# Patient Record
Sex: Male | Born: 1995 | Race: Black or African American | Hispanic: No | Marital: Single | Smoking: Former smoker
Health system: Southern US, Community
[De-identification: ages and names within clinical notes are randomized; demographics above are authoritative.]

## PROBLEM LIST (undated history)

## (undated) DIAGNOSIS — J45909 Unspecified asthma, uncomplicated: Secondary | ICD-10-CM

## (undated) HISTORY — PX: FRACTURE SURGERY: SHX138

---
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
## 2014-05-18 DIAGNOSIS — G8929 Other chronic pain: Secondary | ICD-10-CM | POA: Insufficient documentation

## 2015-02-22 DIAGNOSIS — S82202A Unspecified fracture of shaft of left tibia, initial encounter for closed fracture: Secondary | ICD-10-CM | POA: Insufficient documentation

## 2015-02-22 DIAGNOSIS — S82202S Unspecified fracture of shaft of left tibia, sequela: Secondary | ICD-10-CM | POA: Insufficient documentation

## 2015-06-27 ENCOUNTER — Encounter: Payer: Self-pay | Admitting: Emergency Medicine

## 2015-06-27 ENCOUNTER — Emergency Department
Admission: EM | Admit: 2015-06-27 | Discharge: 2015-06-27 | Disposition: A | Discharge status: Home / Self Care | Payer: Medicaid Other | Attending: Emergency Medicine | Admitting: Emergency Medicine

## 2015-06-27 DIAGNOSIS — F17200 Nicotine dependence, unspecified, uncomplicated: Secondary | ICD-10-CM | POA: Diagnosis not present

## 2015-06-27 DIAGNOSIS — Z113 Encounter for screening for infections with a predominantly sexual mode of transmission: Secondary | ICD-10-CM

## 2015-06-27 LAB — URINALYSIS COMPLETE WITH MICROSCOPIC (ARMC ONLY)
BILIRUBIN URINE: NEGATIVE
Bacteria, UA: NONE SEEN
GLUCOSE, UA: NEGATIVE mg/dL
HGB URINE DIPSTICK: NEGATIVE
KETONES UR: NEGATIVE mg/dL
NITRITE: NEGATIVE
PH: 6 (ref 5.0–8.0)
Protein, ur: NEGATIVE mg/dL
SPECIFIC GRAVITY, URINE: 1.006 (ref 1.005–1.030)

## 2015-06-27 LAB — CHLAMYDIA/NGC RT PCR (ARMC ONLY)
CHLAMYDIA TR: NOT DETECTED
N gonorrhoeae: NOT DETECTED

## 2015-06-27 NOTE — ED Notes (Signed)
States he thinks he may have been exposed to STD.also having some burning with urination  No drainage

## 2015-06-27 NOTE — Discharge Instructions (Signed)
Health Maintenance, Male A healthy lifestyle and preventative care can promote health and wellness.  Maintain regular health, dental, and eye exams.  Eat a healthy diet. Foods like vegetables, fruits, whole grains, low-fat dairy products, and lean protein foods contain the nutrients you need and are low in calories. Decrease your intake of foods high in solid fats, added sugars, and salt. Get information about a proper diet from your health care provider, if necessary.  Regular physical exercise is one of the most important things you can do for your health. Most adults should get at least 150 minutes of moderate-intensity exercise (any activity that increases your heart rate and causes you to sweat) each week. In addition, most adults need muscle-strengthening exercises on 2 or more days a week.   Maintain a healthy weight. The body mass index (BMI) is a screening tool to identify possible weight problems. It provides an estimate of body fat based on height and weight. Your health care provider can find your BMI and can help you achieve or maintain a healthy weight. For males 20 years and older:  A BMI below 18.5 is considered underweight.  A BMI of 18.5 to 24.9 is normal.  A BMI of 25 to 29.9 is considered overweight.  A BMI of 30 and above is considered obese.  Maintain normal blood lipids and cholesterol by exercising and minimizing your intake of saturated fat. Eat a balanced diet with plenty of fruits and vegetables. Blood tests for lipids and cholesterol should begin at age 79 and be repeated every 5 years. If your lipid or cholesterol levels are high, you are over age 83, or you are at high risk for heart disease, you may need your cholesterol levels checked more frequently.Ongoing high lipid and cholesterol levels should be treated with medicines if diet and exercise are not working.  If you smoke, find out from your health care provider how to quit. If you do not use tobacco, do not  start.  Lung cancer screening is recommended for adults aged 55-80 years who are at high risk for developing lung cancer because of a history of smoking. A yearly low-dose CT scan of the lungs is recommended for people who have at least a 30-pack-year history of smoking and are current smokers or have quit within the past 15 years. A pack year of smoking is smoking an average of 1 pack of cigarettes a day for 1 year (for example, a 30-pack-year history of smoking could mean smoking 1 pack a day for 30 years or 2 packs a day for 15 years). Yearly screening should continue until the smoker has stopped smoking for at least 15 years. Yearly screening should be stopped for people who develop a health problem that would prevent them from having lung cancer treatment.  If you choose to drink alcohol, do not have more than 2 drinks per day. One drink is considered to be 12 oz (360 mL) of beer, 5 oz (150 mL) of wine, or 1.5 oz (45 mL) of liquor.  Avoid the use of street drugs. Do not share needles with anyone. Ask for help if you need support or instructions about stopping the use of drugs.  High blood pressure causes heart disease and increases the risk of stroke. High blood pressure is more likely to develop in:  People who have blood pressure in the end of the normal range (100-139/85-89 mm Hg).  People who are overweight or obese.  People who are African American.  If you are 18-39 years of age, have your blood pressure checked every 3-5 years. If you are 40 years of age or older, have your blood pressure checked every year. You should have your blood pressure measured twice--once when you are at a hospital or clinic, and once when you are not at a hospital or clinic. Record the average of the two measurements. To check your blood pressure when you are not at a hospital or clinic, you can use:  An automated blood pressure machine at a pharmacy.  A home blood pressure monitor.  If you are 45-79 years  old, ask your health care provider if you should take aspirin to prevent heart disease.  Diabetes screening involves taking a blood sample to check your fasting blood sugar level. This should be done once every 3 years after age 45 if you are at a normal weight and without risk factors for diabetes. Testing should be considered at a younger age or be carried out more frequently if you are overweight and have at least 1 risk factor for diabetes.  Colorectal cancer can be detected and often prevented. Most routine colorectal cancer screening begins at the age of 50 and continues through age 75. However, your health care provider may recommend screening at an earlier age if you have risk factors for colon cancer. On a yearly basis, your health care provider may provide home test kits to check for hidden blood in the stool. A small camera at the end of a tube may be used to directly examine the colon (sigmoidoscopy or colonoscopy) to detect the earliest forms of colorectal cancer. Talk to your health care provider about this at age 50 when routine screening begins. A direct exam of the colon should be repeated every 5-10 years through age 75, unless early forms of precancerous polyps or small growths are found.  People who are at an increased risk for hepatitis B should be screened for this virus. You are considered at high risk for hepatitis B if:  You were born in a country where hepatitis B occurs often. Talk with your health care provider about which countries are considered high risk.  Your parents were born in a high-risk country and you have not received a shot to protect against hepatitis B (hepatitis B vaccine).  You have HIV or AIDS.  You use needles to inject street drugs.  You live with, or have sex with, someone who has hepatitis B.  You are a man who has sex with other men (MSM).  You get hemodialysis treatment.  You take certain medicines for conditions like cancer, organ  transplantation, and autoimmune conditions.  Hepatitis C blood testing is recommended for all people born from 1945 through 1965 and any individual with known risk factors for hepatitis C.  Healthy men should no longer receive prostate-specific antigen (PSA) blood tests as part of routine cancer screening. Talk to your health care provider about prostate cancer screening.  Testicular cancer screening is not recommended for adolescents or adult males who have no symptoms. Screening includes self-exam, a health care provider exam, and other screening tests. Consult with your health care provider about any symptoms you have or any concerns you have about testicular cancer.  Practice safe sex. Use condoms and avoid high-risk sexual practices to reduce the spread of sexually transmitted infections (STIs).  You should be screened for STIs, including gonorrhea and chlamydia if:  You are sexually active and are younger than 24 years.  You   are older than 24 years, and your health care provider tells you that you are at risk for this type of infection.  Your sexual activity has changed since you were last screened, and you are at an increased risk for chlamydia or gonorrhea. Ask your health care provider if you are at risk.  If you are at risk of being infected with HIV, it is recommended that you take a prescription medicine daily to prevent HIV infection. This is called pre-exposure prophylaxis (PrEP). You are considered at risk if:  You are a man who has sex with other men (MSM).  You are a heterosexual man who is sexually active with multiple partners.  You take drugs by injection.  You are sexually active with a partner who has HIV.  Talk with your health care provider about whether you are at high risk of being infected with HIV. If you choose to begin PrEP, you should first be tested for HIV. You should then be tested every 3 months for as long as you are taking PrEP.  Use sunscreen. Apply  sunscreen liberally and repeatedly throughout the day. You should seek shade when your shadow is shorter than you. Protect yourself by wearing long sleeves, pants, a wide-brimmed hat, and sunglasses year round whenever you are outdoors.  Tell your health care provider of new moles or changes in moles, especially if there is a change in shape or color. Also, tell your health care provider if a mole is larger than the size of a pencil eraser.  A one-time screening for abdominal aortic aneurysm (AAA) and surgical repair of large AAAs by ultrasound is recommended for men aged 65-75 years who are current or former smokers.  Stay current with your vaccines (immunizations).   This information is not intended to replace advice given to you by your health care provider. Make sure you discuss any questions you have with your health care provider.   Document Released: 08/10/2007 Document Revised: 03/04/2014 Document Reviewed: 07/09/2010 Elsevier Interactive Patient Education 2016 [employer]   Safe Sex  Safe sex is about reducing the risk of giving or getting a sexually transmitted disease (STD). STDs are spread through sexual contact involving the genitals, mouth, or rectum. Some STDs can be cured and others cannot. Safe sex can also prevent unintended pregnancies.  WHAT ARE SOME SAFE SEX PRACTICES?  Limit your sexual activity to only one partner who is having sex with only you.  Talk to your partner about his or her past partners, past STDs, and drug use.  Use a condom every time you have sexual intercourse. This includes vaginal, oral, and anal sexual activity. Both females and males should wear condoms during oral sex. Only use latex or polyurethane condoms and water-based lubricants. Using petroleum-based lubricants or oils to lubricate a condom will weaken the condom and increase the chance that it will break. The condom should be in place from the beginning to the end of sexual activity. Wearing a  condom reduces, but does not completely eliminate, your risk of getting or giving an STD. STDs can be spread by contact with infected body fluids and skin.  Get vaccinated for hepatitis B and HPV.  Avoid alcohol and recreational drugs, which can affect your judgment. You may forget to use a condom or participate in high-risk sex.  For females, avoid douching after sexual intercourse. Douching can spread an infection farther into the reproductive tract.  Check your body for signs of sores, blisters, rashes, or unusual  your health care provider if you notice any of these signs.  Avoid sexual contact if you have symptoms of an infection or are being treated for an STD. If you or your partner has herpes, avoid sexual contact when blisters are present. Use condoms at all other times.  If you are at risk of being infected with HIV, it is recommended that you take a prescription medicine daily to prevent HIV infection. This is called pre-exposure prophylaxis (PrEP). You are considered at risk if:  You are a man who has sex with other men (MSM).  You are a heterosexual man or woman who is sexually active with more than one partner.  You take drugs by injection.  You are sexually active with a partner who has HIV.  Talk with your health care provider about whether you are at high risk of being infected with HIV. If you choose to begin PrEP, you should first be tested for HIV. You should then be tested every 3 months for as long as you are taking PrEP.  See your health care provider for regular screenings, exams, and tests for other STDs. Before having sex with a new partner, each of you should be screened for STDs and should talk about the results with each other. WHAT ARE THE BENEFITS OF SAFE SEX?   There is less chance of getting or giving an STD.  You can prevent unwanted or unintended pregnancies.  By discussing safe sex concerns with your partner, you may increase feelings  of intimacy, comfort, trust, and honesty between the two of you.   This information is not intended to replace advice given to you by your health care provider. Make sure you discuss any questions you have with your health care provider.   Document Released: 03/21/2004 Document Revised: 03/04/2014 Document Reviewed: 08/05/2011 Elsevier Interactive Patient Education 2016 Elsevier Inc.   

## 2015-06-27 NOTE — ED Provider Notes (Signed)
[facility] Emergency Department Provider Note  ____________________________________________  Time seen: Approximately 4:46 PM  I have reviewed the triage vital signs and the nursing notes.   HISTORY  Chief Complaint Urinary Frequency    HPI James Sampson is a 20 y.o. male, NAD, presents to the emergency department today for STD screen. Overall states he feels well and is asymptomatic and is here just as a precaution. He admits to recent sexual encounter but is unsure of STD status of partner. Denies any other possible exposures. He denies dysuria, burning with urination, hematuria, discharge, and fever. Has had no chills, night sweats, abdominal pain, nausea, vomiting.   History reviewed. No pertinent past medical history.  There are no active problems to display for this patient.   History reviewed. No pertinent past surgical history.  No current outpatient prescriptions on file.  Allergies Review of patient's allergies indicates no known allergies.  History reviewed. No pertinent family history.  Social History Social History  Substance Use Topics  . Smoking status: Current Every Day Smoker  . Smokeless tobacco: None  . Alcohol Use: Yes     Review of Systems  Constitutional: No fever/chills, Night sweats Eyes:  No discharge ENT: No sore throat. Cardiovascular: No chest pain. Respiratory:No shortness of breath. No wheezing.  Gastrointestinal: No abdominal pain.  No nausea, vomiting.  No diarrhea.  No constipation. Genitourinary:  Negative for dysuria, hematuria. No urinary hesitancy, urgency or increased frequency. No urethral discharge Musculoskeletal: Negative for general myalgias.  Skin: Negative for rash or skin sores, open wounds. Neurological: Negative for headaches, focal weakness or numbness. 10-point ROS otherwise negative.  ____________________________________________   PHYSICAL EXAM:  VITAL SIGNS: ED Triage Vitals  Enc  Vitals Group     BP 06/27/15 1623 113/85 mmHg     Pulse Rate 06/27/15 1623 92     Resp 06/27/15 1623 20     Temp 06/27/15 1623 99.4 F (37.4 C)     Temp Source 06/27/15 1623 Oral     SpO2 06/27/15 1623 99 %     Weight 06/27/15 1623 150 lb (68.04 kg)     Height 06/27/15 1623  (1.702 m)     Head Cir --      Peak Flow --      Pain Score 06/27/15 1552 0     Pain Loc --      Pain Edu? --      Excl. in GC? --    Constitutional: Alert and oriented. Well appearing and in no acute distress. Eyes: Conjunctivae are normal.  Head: Atraumatic. Hematological/Lymphatic/Immunilogical: No cervical lymphadenopathy. Respiratory: Normal respiratory effort without tachypnea or retractions. Neurologic:  Normal speech and language.  Skin:  Skin is warm, dry and intact.  Psychiatric: Mood and affect are normal. Speech and behavior are normal. Patient exhibits appropriate insight and judgement.   ____________________________________________   LABS (all labs ordered are listed, but only abnormal results are displayed)  Labs Reviewed  URINALYSIS COMPLETEWITH MICROSCOPIC ([facility] ONLY) - Abnormal; Notable for the following:    Color, Urine STRAW (*)    APPearance CLEAR (*)    Leukocytes, UA TRACE (*)    Squamous Epithelial / LPF 0-5 (*)    All other components within normal limits  URINE CULTURE  CHLAMYDIA/NGC RT PCR ([facility] ONLY)   ____________________________________________  EKG  None ____________________________________________  RADIOLOGY  None ____________________________________________    PROCEDURES  Procedure(s) performed: None    Medications - No data to display  ____________________________________________   INITIAL IMPRESSION / ASSESSMENT AND PLAN / ED COURSE  Pertinent lab results that were available during my care of the patient were reviewed by me and considered in my medical decision making (see chart for details).  Patient's diagnosis is  consistent with screening for STD. Patient will be discharged home with instructions to reverse to safe sex practices. Patient is to follow-up with the [facility] for any further STD screening with any symptoms may arise.  Patient is given ED precautions to return to the ED for any worsening or new symptoms.     ____________________________________________  FINAL CLINICAL IMPRESSION(S) / ED DIAGNOSES  Final diagnoses:  Screening for STD (sexually transmitted disease)      NEW MEDICATIONS STARTED DURING THIS VISIT:  There are no discharge medications for this patient.        Hope PigeonJami L Naleah Kofoed, PA-C 06/27/15 1813  Sharman CheekPhillip Stafford, MD 06/29/15 [phone]

## 2015-06-27 NOTE — ED Notes (Signed)
Pt to ed with c/o burning and dryness in penis.  States he wants to be checked for STI

## 2015-06-29 LAB — URINE CULTURE: SPECIAL REQUESTS: NORMAL

## 2016-05-16 ENCOUNTER — Emergency Department: Payer: No Typology Code available for payment source

## 2016-05-16 ENCOUNTER — Emergency Department
Admission: EM | Admit: 2016-05-16 | Discharge: 2016-05-16 | Disposition: A | Discharge status: Home / Self Care | Payer: No Typology Code available for payment source | Attending: Emergency Medicine | Admitting: Emergency Medicine

## 2016-05-16 DIAGNOSIS — Y9389 Activity, other specified: Secondary | ICD-10-CM | POA: Insufficient documentation

## 2016-05-16 DIAGNOSIS — F17210 Nicotine dependence, cigarettes, uncomplicated: Secondary | ICD-10-CM | POA: Diagnosis not present

## 2016-05-16 DIAGNOSIS — Y92410 Unspecified street and highway as the place of occurrence of the external cause: Secondary | ICD-10-CM | POA: Insufficient documentation

## 2016-05-16 DIAGNOSIS — R1030 Lower abdominal pain, unspecified: Secondary | ICD-10-CM | POA: Diagnosis not present

## 2016-05-16 DIAGNOSIS — S3991XA Unspecified injury of abdomen, initial encounter: Secondary | ICD-10-CM | POA: Diagnosis present

## 2016-05-16 DIAGNOSIS — J45909 Unspecified asthma, uncomplicated: Secondary | ICD-10-CM | POA: Diagnosis not present

## 2016-05-16 DIAGNOSIS — Y999 Unspecified external cause status: Secondary | ICD-10-CM | POA: Diagnosis not present

## 2016-05-16 HISTORY — DX: Unspecified asthma, uncomplicated: J45.909

## 2016-05-16 LAB — URINALYSIS, COMPLETE (UACMP) WITH MICROSCOPIC
Bacteria, UA: NONE SEEN
Bilirubin Urine: NEGATIVE
GLUCOSE, UA: NEGATIVE mg/dL
HGB URINE DIPSTICK: NEGATIVE
KETONES UR: NEGATIVE mg/dL
LEUKOCYTES UA: NEGATIVE
NITRITE: NEGATIVE
PH: 5 (ref 5.0–8.0)
PROTEIN: 30 mg/dL — AB
Specific Gravity, Urine: 1.028 (ref 1.005–1.030)

## 2016-05-16 MED ORDER — HYDROCODONE-ACETAMINOPHEN 5-325 MG PO TABS
1.0000 | ORAL_TABLET | Freq: Once | ORAL | Status: AC
  Administered 2016-05-16: 1 via ORAL
  Filled 2016-05-16: qty 1
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
## 2016-05-18 ENCOUNTER — Emergency Department
Admission: EM | Admit: 2016-05-18 | Discharge: 2016-05-18 | Disposition: A | Discharge status: Home / Self Care | Payer: Medicaid Other | Attending: Emergency Medicine | Admitting: Emergency Medicine

## 2016-05-18 ENCOUNTER — Encounter: Payer: Self-pay | Admitting: Emergency Medicine

## 2016-05-18 DIAGNOSIS — R51 Headache: Secondary | ICD-10-CM | POA: Diagnosis not present

## 2016-05-18 DIAGNOSIS — F17210 Nicotine dependence, cigarettes, uncomplicated: Secondary | ICD-10-CM | POA: Insufficient documentation

## 2016-05-18 DIAGNOSIS — J45909 Unspecified asthma, uncomplicated: Secondary | ICD-10-CM | POA: Insufficient documentation

## 2016-05-18 DIAGNOSIS — M545 Low back pain: Secondary | ICD-10-CM | POA: Diagnosis not present

## 2016-05-18 DIAGNOSIS — S0990XD Unspecified injury of head, subsequent encounter: Secondary | ICD-10-CM | POA: Diagnosis present

## 2016-05-18 DIAGNOSIS — M7918 Myalgia, other site: Secondary | ICD-10-CM

## 2016-05-18 LAB — URINALYSIS, COMPLETE (UACMP) WITH MICROSCOPIC
Bacteria, UA: NONE SEEN
Bilirubin Urine: NEGATIVE
Glucose, UA: NEGATIVE mg/dL
Hgb urine dipstick: NEGATIVE
Ketones, ur: NEGATIVE mg/dL
Leukocytes, UA: NEGATIVE
Nitrite: NEGATIVE
PH: 5 (ref 5.0–8.0)
Protein, ur: NEGATIVE mg/dL
Specific Gravity, Urine: 1.018 (ref 1.005–1.030)

## 2016-05-18 MED ORDER — KETOROLAC TROMETHAMINE 30 MG/ML IJ SOLN
30.0000 mg | Freq: Once | INTRAMUSCULAR | Status: AC
  Administered 2016-05-18: 30 mg via INTRAMUSCULAR
  Filled 2016-05-18: qty 1

## 2016-05-18 MED ORDER — NAPROXEN 500 MG PO TABS: 500.0000 mg | ORAL_TABLET | Freq: Two times a day (BID) | ORAL | 0 refills | Status: DC

## 2016-05-18 MED ORDER — TRAMADOL HCL 50 MG PO TABS: 50.0000 mg | ORAL_TABLET | Freq: Two times a day (BID) | ORAL | 0 refills | Status: DC | PRN

## 2016-05-18 NOTE — Discharge Instructions (Signed)
[place] to [place] doctor in [place] for a scheduled appointment. Begin taking naproxen 500 mg 1 twice a day with food. Tramadol 1 every 12 hours as needed for moderate pain. You will  need to see your doctor for any continued pain medication.
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
## 2017-09-23 ENCOUNTER — Ambulatory Visit: Payer: Self-pay | Admitting: Nurse Practitioner

## 2017-10-06 ENCOUNTER — Other Ambulatory Visit: Payer: Self-pay

## 2017-10-06 ENCOUNTER — Emergency Department
Admission: EM | Admit: 2017-10-06 | Discharge: 2017-10-06 | Disposition: A | Discharge status: Home / Self Care | Payer: Self-pay | Attending: Emergency Medicine | Admitting: Emergency Medicine

## 2017-10-06 ENCOUNTER — Emergency Department: Payer: Self-pay

## 2017-10-06 DIAGNOSIS — Y929 Unspecified place or not applicable: Secondary | ICD-10-CM | POA: Insufficient documentation

## 2017-10-06 DIAGNOSIS — F17210 Nicotine dependence, cigarettes, uncomplicated: Secondary | ICD-10-CM | POA: Insufficient documentation

## 2017-10-06 DIAGNOSIS — J45909 Unspecified asthma, uncomplicated: Secondary | ICD-10-CM | POA: Insufficient documentation

## 2017-10-06 DIAGNOSIS — Y939 Activity, unspecified: Secondary | ICD-10-CM | POA: Insufficient documentation

## 2017-10-06 DIAGNOSIS — S62339A Displaced fracture of neck of unspecified metacarpal bone, initial encounter for closed fracture: Secondary | ICD-10-CM

## 2017-10-06 DIAGNOSIS — Y999 Unspecified external cause status: Secondary | ICD-10-CM | POA: Insufficient documentation

## 2017-10-06 DIAGNOSIS — S62316A Displaced fracture of base of fifth metacarpal bone, right hand, initial encounter for closed fracture: Secondary | ICD-10-CM | POA: Insufficient documentation

## 2017-10-06 MED ORDER — OXYCODONE-ACETAMINOPHEN 5-325 MG PO TABS
1.0000 | ORAL_TABLET | Freq: Once | ORAL | Status: AC
  Administered 2017-10-06: 1 via ORAL
  Filled 2017-10-06: qty 1

## 2017-10-06 MED ORDER — OXYCODONE-ACETAMINOPHEN 5-325 MG PO TABS: 1.0000 | ORAL_TABLET | ORAL | 0 refills | Status: AC | PRN
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
## 2017-10-14 ENCOUNTER — Ambulatory Visit: Payer: Self-pay | Admitting: Nurse Practitioner

## 2018-09-20 ENCOUNTER — Encounter: Payer: Self-pay | Admitting: Emergency Medicine

## 2018-09-20 ENCOUNTER — Other Ambulatory Visit: Payer: Self-pay

## 2018-09-20 ENCOUNTER — Emergency Department
Admission: EM | Admit: 2018-09-20 | Discharge: 2018-09-20 | Disposition: A | Discharge status: Home / Self Care | Payer: Medicaid Other | Attending: Emergency Medicine | Admitting: Emergency Medicine

## 2018-09-20 ENCOUNTER — Emergency Department: Payer: Medicaid Other

## 2018-09-20 DIAGNOSIS — J45909 Unspecified asthma, uncomplicated: Secondary | ICD-10-CM | POA: Insufficient documentation

## 2018-09-20 DIAGNOSIS — M79605 Pain in left leg: Secondary | ICD-10-CM

## 2018-09-20 DIAGNOSIS — R0600 Dyspnea, unspecified: Secondary | ICD-10-CM | POA: Insufficient documentation

## 2018-09-20 DIAGNOSIS — F17200 Nicotine dependence, unspecified, uncomplicated: Secondary | ICD-10-CM | POA: Insufficient documentation

## 2018-09-20 LAB — CBC
HCT: 44.4 % (ref 39.0–52.0)
Hemoglobin: 15.1 g/dL (ref 13.0–17.0)
MCH: 32.9 pg (ref 26.0–34.0)
MCHC: 34 g/dL (ref 30.0–36.0)
MCV: 96.7 fL (ref 80.0–100.0)
Platelets: 176 10*3/uL (ref 150–400)
RBC: 4.59 MIL/uL (ref 4.22–5.81)
RDW: 12.7 % (ref 11.5–15.5)
WBC: 5.8 10*3/uL (ref 4.0–10.5)
nRBC: 0 % (ref 0.0–0.2)

## 2018-09-20 LAB — BASIC METABOLIC PANEL
Anion gap: 7 (ref 5–15)
BUN: 11 mg/dL (ref 6–20)
CO2: 26 mmol/L (ref 22–32)
Calcium: 9.9 mg/dL (ref 8.9–10.3)
Chloride: 107 mmol/L (ref 98–111)
Creatinine, Ser: 0.98 mg/dL (ref 0.61–1.24)
GFR calc Af Amer: >60 mL/min (ref >60)
GFR calc non Af Amer: >60 mL/min (ref >60)
Glucose, Bld: 85 mg/dL (ref 70–99)
Potassium: 3.8 mmol/L (ref 3.5–5.1)
Sodium: 140 mmol/L (ref 135–145)

## 2018-09-20 MED ORDER — GABAPENTIN 100 MG PO CAPS: 100.0000 mg | ORAL_CAPSULE | Freq: Three times a day (TID) | ORAL | 2 refills | Status: AC
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
## 2018-09-20 NOTE — ED Notes (Signed)
Patient had stated he did not want to stay and be seen, now is stating "just leave me in there, I'm gonna get some air and come back in."

## 2019-09-01 DIAGNOSIS — Z87828 Personal history of other (healed) physical injury and trauma: Secondary | ICD-10-CM | POA: Insufficient documentation

## 2019-09-01 DIAGNOSIS — R202 Paresthesia of skin: Secondary | ICD-10-CM | POA: Insufficient documentation

## 2019-12-09 DIAGNOSIS — T8484XA Pain due to internal orthopedic prosthetic devices, implants and grafts, initial encounter: Secondary | ICD-10-CM | POA: Insufficient documentation

## 2019-12-09 DIAGNOSIS — M6240 Contracture of muscle, unspecified site: Secondary | ICD-10-CM | POA: Insufficient documentation
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
## 2021-06-18 IMAGING — CR CHEST - 2 VIEW
1 series · 2 of 2 positions shown · non-contrast
Comparison: None.

CLINICAL DATA: Shortness of breath

EXAM:
CHEST - 2 VIEW

[Series 1: dg chest 2 view · 0.14mm/px · 2 of 2 slices shown]
[im 1/2]
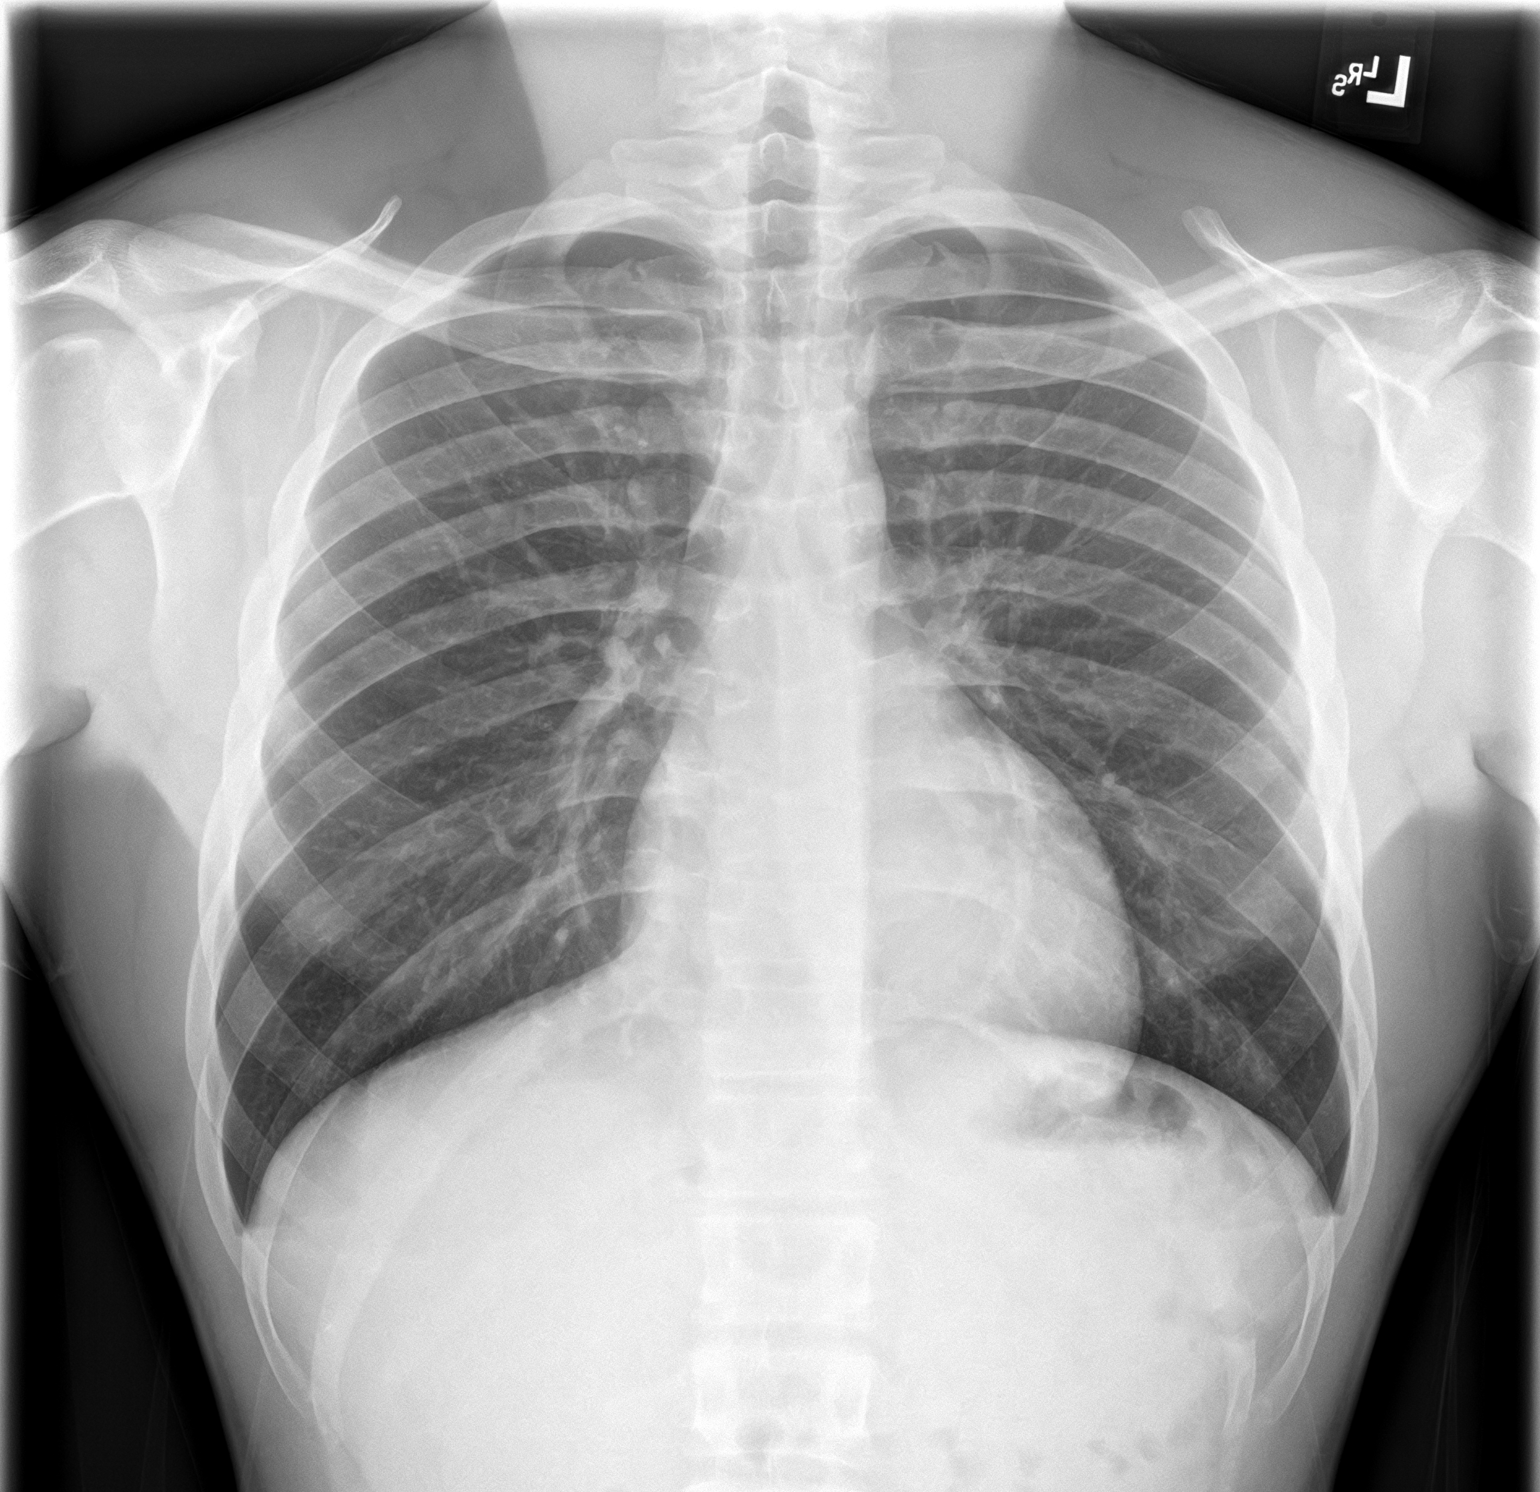
[im 2/2]
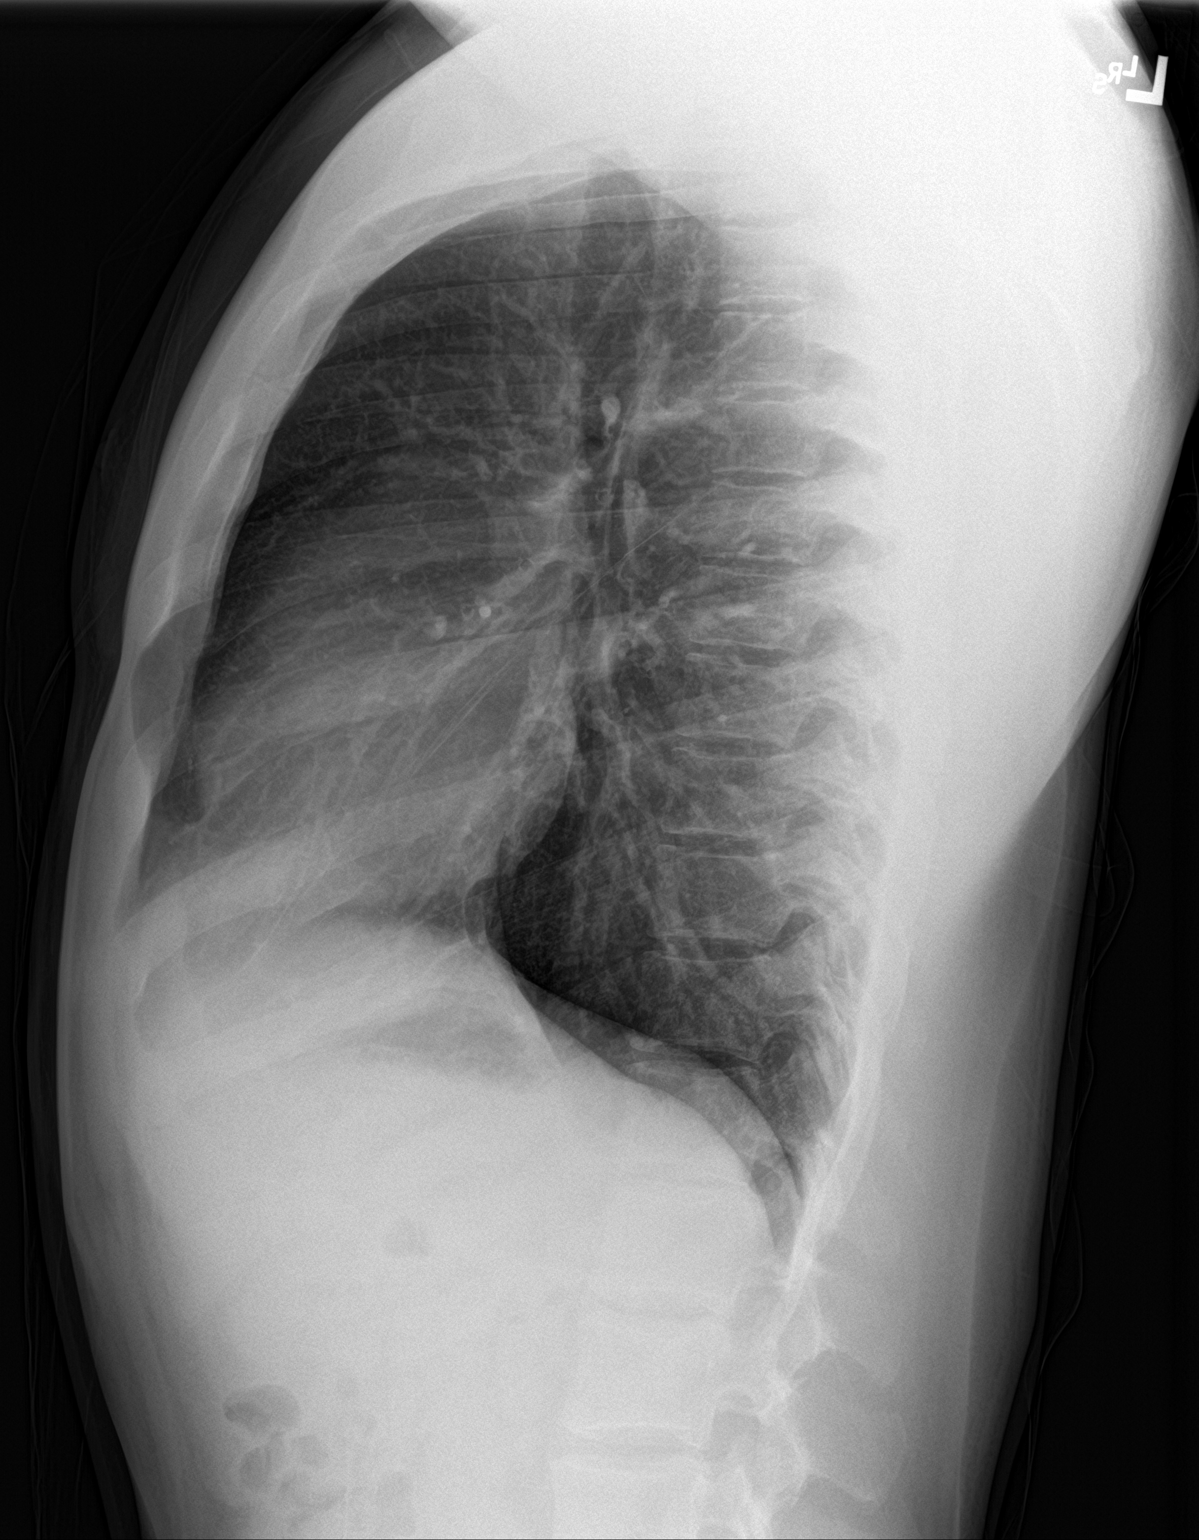

[2 of 2 positions shown; findings below may reference images not displayed]

FINDINGS: The heart size and mediastinal contours are within normal limits.
Both lungs are clear. The visualized skeletal structures are
unremarkable.
IMPRESSION: No acute abnormality of the lungs.

## 2021-07-02 DIAGNOSIS — Z7289 Other problems related to lifestyle: Secondary | ICD-10-CM | POA: Diagnosis not present

## 2021-07-02 DIAGNOSIS — Z87828 Personal history of other (healed) physical injury and trauma: Secondary | ICD-10-CM | POA: Diagnosis not present

## 2021-07-02 DIAGNOSIS — M25562 Pain in left knee: Secondary | ICD-10-CM | POA: Diagnosis not present

## 2021-07-02 DIAGNOSIS — G8929 Other chronic pain: Secondary | ICD-10-CM | POA: Diagnosis not present

## 2021-07-02 DIAGNOSIS — Z87891 Personal history of nicotine dependence: Secondary | ICD-10-CM | POA: Diagnosis not present

## 2021-09-04 DIAGNOSIS — Z472 Encounter for removal of internal fixation device: Secondary | ICD-10-CM | POA: Diagnosis not present

## 2021-09-04 DIAGNOSIS — T8484XA Pain due to internal orthopedic prosthetic devices, implants and grafts, initial encounter: Secondary | ICD-10-CM | POA: Diagnosis not present

## 2021-09-06 DIAGNOSIS — S83281D Other tear of lateral meniscus, current injury, right knee, subsequent encounter: Secondary | ICD-10-CM | POA: Diagnosis not present

## 2021-10-03 DIAGNOSIS — Z4801 Encounter for change or removal of surgical wound dressing: Secondary | ICD-10-CM | POA: Diagnosis not present

## 2021-10-03 DIAGNOSIS — Z7289 Other problems related to lifestyle: Secondary | ICD-10-CM | POA: Diagnosis not present

## 2021-10-03 DIAGNOSIS — M79669 Pain in unspecified lower leg: Secondary | ICD-10-CM | POA: Diagnosis not present

## 2021-10-03 DIAGNOSIS — M79605 Pain in left leg: Secondary | ICD-10-CM | POA: Diagnosis not present

## 2021-10-03 DIAGNOSIS — M7989 Other specified soft tissue disorders: Secondary | ICD-10-CM | POA: Diagnosis not present

## 2021-10-03 DIAGNOSIS — M79673 Pain in unspecified foot: Secondary | ICD-10-CM | POA: Diagnosis not present

## 2021-10-03 DIAGNOSIS — M79662 Pain in left lower leg: Secondary | ICD-10-CM | POA: Diagnosis not present

## 2021-10-03 DIAGNOSIS — Z91013 Allergy to seafood: Secondary | ICD-10-CM | POA: Diagnosis not present

## 2021-10-03 DIAGNOSIS — Z87891 Personal history of nicotine dependence: Secondary | ICD-10-CM | POA: Diagnosis not present

## 2021-10-03 DIAGNOSIS — Z7982 Long term (current) use of aspirin: Secondary | ICD-10-CM | POA: Diagnosis not present

## 2021-10-03 DIAGNOSIS — Z5189 Encounter for other specified aftercare: Secondary | ICD-10-CM | POA: Diagnosis not present

## 2022-01-25 DIAGNOSIS — Z419 Encounter for procedure for purposes other than remedying health state, unspecified: Secondary | ICD-10-CM | POA: Diagnosis not present

## 2022-01-30 ENCOUNTER — Ambulatory Visit: Payer: Medicaid Other | Admitting: Pain Medicine

## 2022-01-31 ENCOUNTER — Telehealth: Payer: Self-pay

## 2022-01-31 NOTE — Telephone Encounter (Signed)
Copied from CRM [id number]. Topic: General - Other >> Jan 31, 2022 12:39 PM Franchot Heidelberg wrote: Reason for CRM: Pt called seeking food assistance, has est. Appt scheduled in May   Best contact: [phone] or [phone]

## 2022-02-01 ENCOUNTER — Telehealth: Payer: Self-pay

## 2022-02-01 NOTE — Telephone Encounter (Signed)
..   Medicaid Managed Care Note  02/01/2022 Name: James Sampson MRN: [id number] DOB: 19-Feb-1996  James Sampson is a 26 y.o. year old male who is a primary care patient of Pcp, No and is actively engaged with the care management team. I reached out to Ricky Stabs by phone today to assist with scheduling an initial visit with the BSW  Follow up plan: Unsuccessful telephone outreach attempt made. A HIPAA compliant phone message was left for the patient providing contact information and requesting a return call.  The care management team will reach out to the patient again over the next 7 days.   Weston Settle Care Guide, High Risk Medicaid Managed Care Embedded Care Coordination [facility]  [facility]

## 2022-02-04 ENCOUNTER — Other Ambulatory Visit: Payer: Medicaid Other

## 2022-02-04 NOTE — Patient Outreach (Signed)
  Medicaid Managed Care   Unsuccessful Outreach Note  02/04/2022 Name: James Sampson MRN: [id number] DOB: January 26, 1996  Referred by: Pcp, No Reason for referral : High Risk Managed Medicaid (MM social work Soil scientist )   An unsuccessful telephone outreach was attempted today. The patient was referred to the case management team for assistance with care management and care coordination.   Follow Up Plan: The care management team will reach out to the patient again over the next 7 days.   Gus Puma, BSW, [place] [facility]    High Risk Managed Medicaid Team  [phone]

## 2022-02-04 NOTE — Patient Instructions (Signed)
  Medicaid Managed Care   Unsuccessful Outreach Note  02/04/2022 Name: James Sampson MRN: [id number] DOB: 10/30/1995  Referred by: Pcp, No Reason for referral : High Risk Managed Medicaid (MM social work telephone outreach )   An unsuccessful telephone outreach was attempted today. The patient was referred to the case management team for assistance with care management and care coordination.   Follow Up Plan: The care management team will reach out to the patient again over the next 7 days.   Horace Wishon, BSW, MHA [facility]  [place]  High Risk Managed Medicaid Team  [phone]  

## 2022-02-06 ENCOUNTER — Telehealth: Payer: Self-pay

## 2022-02-06 NOTE — Telephone Encounter (Signed)
Pt says he is in school, so sometimes he cannot answer.  And then by calling twice he knows it is you!

## 2022-02-06 NOTE — Telephone Encounter (Addendum)
Pt would like you to call him 2 times if he doesn't pick up. Pt states he really needs to speak w/ you.

## 2022-02-06 NOTE — Patient Instructions (Signed)
Visit Information  Mr. James Sampson  - as a part of your Medicaid benefit, you are eligible for care management and care coordination services at no cost or copay. I was unable to reach you by phone today but would be happy to help you with your health related needs. Please feel free to call me @ [phone]).   A member of the Managed Medicaid care management team will reach out to you again over the next 7 days.   Gus Puma, BSW, [place] [facility]  [place]  High Risk Managed Medicaid Team  [phone]

## 2022-02-06 NOTE — Patient Outreach (Signed)
  Medicaid Managed Care   Unsuccessful Outreach Note  02/06/2022 Name: James Sampson MRN: [id number] DOB: [birth year]  Referred by: Pcp, No Reason for referral : High Risk Managed Medicaid (MM Social work Soil scientist )   A second unsuccessful telephone outreach was attempted today. The patient was referred to the case management team for assistance with care management and care coordination.   Follow Up Plan: The care management team will reach out to the patient again over the next 7 days.   BSW received a voicemail from patient and received a not stating to try patient twice.  Gus Puma, BSW, [place] [facility]  [place]  High Risk Managed Medicaid Team  [phone]

## 2022-02-25 DIAGNOSIS — Z419 Encounter for procedure for purposes other than remedying health state, unspecified: Secondary | ICD-10-CM | POA: Diagnosis not present

## 2022-02-27 ENCOUNTER — Telehealth: Payer: Self-pay

## 2022-02-27 NOTE — Telephone Encounter (Signed)
Copied from [place] [id number] [id number]. Topic: General - Other >> Feb 27, 2022  8:41 AM Everette C wrote: Reason for CRM: The patient would like to be contacted by a social worker if possible  The patient has a new patient appt on 07/24/22 and would like to discuss resources and assistance with an emphasis on food   Please contact the patient further when possible

## 2022-03-25 DIAGNOSIS — S82209A Unspecified fracture of shaft of unspecified tibia, initial encounter for closed fracture: Secondary | ICD-10-CM | POA: Insufficient documentation
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
## 2022-03-26 DIAGNOSIS — M899 Disorder of bone, unspecified: Secondary | ICD-10-CM | POA: Insufficient documentation

## 2022-03-26 DIAGNOSIS — G894 Chronic pain syndrome: Secondary | ICD-10-CM | POA: Insufficient documentation

## 2022-03-26 DIAGNOSIS — Z789 Other specified health status: Secondary | ICD-10-CM | POA: Insufficient documentation

## 2022-03-26 DIAGNOSIS — Z79899 Other long term (current) drug therapy: Secondary | ICD-10-CM | POA: Insufficient documentation

## 2022-03-27 ENCOUNTER — Encounter: Payer: Self-pay | Admitting: Pain Medicine

## 2022-03-27 ENCOUNTER — Ambulatory Visit: Payer: Medicaid Other | Attending: Pain Medicine | Admitting: Pain Medicine

## 2022-03-27 VITALS — BP 155/88 | HR 79 | Temp 96.6°F | Resp 16 | Ht 67.0 in | Wt 155.0 lb

## 2022-03-27 DIAGNOSIS — R202 Paresthesia of skin: Secondary | ICD-10-CM | POA: Diagnosis not present

## 2022-03-27 DIAGNOSIS — G894 Chronic pain syndrome: Secondary | ICD-10-CM

## 2022-03-27 DIAGNOSIS — M79675 Pain in left toe(s): Secondary | ICD-10-CM | POA: Diagnosis not present

## 2022-03-27 DIAGNOSIS — F1290 Cannabis use, unspecified, uncomplicated: Secondary | ICD-10-CM

## 2022-03-27 DIAGNOSIS — S82202S Unspecified fracture of shaft of left tibia, sequela: Secondary | ICD-10-CM

## 2022-03-27 DIAGNOSIS — G8929 Other chronic pain: Secondary | ICD-10-CM | POA: Diagnosis not present

## 2022-03-27 DIAGNOSIS — M899 Disorder of bone, unspecified: Secondary | ICD-10-CM | POA: Diagnosis not present

## 2022-03-27 DIAGNOSIS — M79605 Pain in left leg: Secondary | ICD-10-CM

## 2022-03-27 DIAGNOSIS — Z789 Other specified health status: Secondary | ICD-10-CM | POA: Diagnosis not present

## 2022-03-27 DIAGNOSIS — M6240 Contracture of muscle, unspecified site: Secondary | ICD-10-CM

## 2022-03-27 DIAGNOSIS — Z79899 Other long term (current) drug therapy: Secondary | ICD-10-CM | POA: Diagnosis not present

## 2022-03-27 DIAGNOSIS — M791 Myalgia, unspecified site: Secondary | ICD-10-CM

## 2022-03-27 NOTE — Progress Notes (Signed)
Safety precautions to be maintained throughout the outpatient stay will include: orient to surroundings, keep bed in low position, maintain call bell within reach at all times, provide assistance with transfer out of bed and ambulation.   Patient refused lab work, "Tired of being stuck". He did UDS.  Patient does not want to do procedures at this time. He changed his mind after talking with patient,

## 2022-03-27 NOTE — Patient Instructions (Signed)
____________________________________________________________________________________________  New Patients  Welcome to [facility].   Initial Visit The first or initial visit consists of an evaluation only.   Interventional pain management.  We offer therapies other than opioid controlled substances to manage chronic pain. These include, but are not limited to, diagnostic, therapeutic, and palliative specialized injection therapies (i.e.: Epidural Steroids, Facet Blocks, etc.). We specialize in a variety of nerve blocks as well as radiofrequency treatments. We offer pain implant evaluations and trials, as well as follow up management. In addition we also provide a variety joint injections, including Viscosupplementation (AKA: Gel Therapy).  Prescription Pain Medication We provide evaluations for/of pharmacologic therapies. Recommendations will follow CDC Guidelines.  We no longer take patients for long-term medication management. We will not be taking over your pain medications.  ____________________________________________________________________________________________    ____________________________________________________________________________________________  Patient Information update  To: All of our patients.  Re: Name change.  It has been made official that our current name, "[facility]"   will soon be changed to "[facility]".   The purpose of this change is to eliminate any confusion created by the concept of our practice being a "Medication Management Pain Clinic". In the past this has led to the misconception that we treat pain primarily by the use of prescription medications.  Nothing can be farther from the truth.   Understanding PAIN MANAGEMENT: To further understand what our practice does, you  first have to understand that "Pain Management" is a subspecialty that requires additional training once a physician has completed their specialty training, which can be in either Anesthesia, Neurology, Psychiatry, or Physical Medicine and Rehabilitation (PMR). Each one of these contributes to the final approach taken by each physician to the management of their patient's pain. To be a "Pain Management Specialist" you must have first completed one of the specialty trainings below.  Anesthesiologists - trained in clinical pharmacology and interventional techniques such as nerve blockade and regional as well as central neuroanatomy. They are trained to block pain before, during, and after surgical interventions.  Neurologists - trained in the diagnosis and pharmacological treatment of complex neurological conditions, such as Multiple Sclerosis, Parkinson's, spinal cord injuries, and other systemic conditions that may be associated with symptoms that may include but are not limited to pain. They tend to rely primarily on the treatment of chronic pain using prescription medications.  Psychiatrist - trained in conditions affecting the psychosocial wellbeing of patients including but not limited to depression, anxiety, schizophrenia, personality disorders, addiction, and other substance use disorders that may be associated with chronic pain. They tend to rely primarily on the treatment of chronic pain using prescription medications.   Physical Medicine and Rehabilitation (PMR) physicians, also known as physiatrists - trained to treat a wide variety of medical conditions affecting the brain, spinal cord, nerves, bones, joints, ligaments, muscles, and tendons. Their training is primarily aimed at treating patients that have suffered injuries that have caused severe physical impairment. Their training is primarily aimed at the physical therapy and rehabilitation of those patients. They may also work alongside  orthopedic surgeons or neurosurgeons using their expertise in assisting surgical patients to recover after their surgeries.  INTERVENTIONAL PAIN MANAGEMENT is sub-subspecialty of Pain Management.  Our physicians are Board-certified in Anesthesia, Pain Management, and Interventional Pain Management.  This meaning that not only have they been trained and Board-certified in their specialty of Anesthesia, and  subspecialty of Pain Management, but they have also received further training in the sub-subspecialty of Interventional Pain Management, in order to become Board-certified as INTERVENTIONAL PAIN MANAGEMENT SPECIALIST.    Mission: Our goal is to use our skills in  [place] as alternatives to the chronic use of prescription opioid medications for the treatment of pain. To make this more clear, we have changed our name to reflect what we do and offer. We will continue to offer medication management assessment and recommendations, but we will not be taking over any patient's medication management.  ____________________________________________________________________________________________

## 2022-03-28 DIAGNOSIS — Z419 Encounter for procedure for purposes other than remedying health state, unspecified: Secondary | ICD-10-CM | POA: Diagnosis not present

## 2022-03-28 DIAGNOSIS — R112 Nausea with vomiting, unspecified: Secondary | ICD-10-CM | POA: Diagnosis not present

## 2022-03-28 DIAGNOSIS — R519 Headache, unspecified: Secondary | ICD-10-CM | POA: Diagnosis not present

## 2022-04-03 DIAGNOSIS — M79606 Pain in leg, unspecified: Secondary | ICD-10-CM | POA: Diagnosis not present

## 2022-04-03 DIAGNOSIS — G43109 Migraine with aura, not intractable, without status migrainosus: Secondary | ICD-10-CM | POA: Diagnosis not present

## 2022-04-03 DIAGNOSIS — G8929 Other chronic pain: Secondary | ICD-10-CM | POA: Diagnosis not present

## 2022-04-16 DIAGNOSIS — M79605 Pain in left leg: Secondary | ICD-10-CM | POA: Diagnosis not present

## 2022-04-16 DIAGNOSIS — G8929 Other chronic pain: Secondary | ICD-10-CM | POA: Diagnosis not present

## 2022-04-16 DIAGNOSIS — Z8781 Personal history of (healed) traumatic fracture: Secondary | ICD-10-CM | POA: Diagnosis not present

## 2022-04-16 DIAGNOSIS — R29898 Other symptoms and signs involving the musculoskeletal system: Secondary | ICD-10-CM | POA: Diagnosis not present

## 2022-04-23 DIAGNOSIS — M79605 Pain in left leg: Secondary | ICD-10-CM | POA: Diagnosis not present

## 2022-04-23 DIAGNOSIS — G8929 Other chronic pain: Secondary | ICD-10-CM | POA: Diagnosis not present

## 2022-04-23 DIAGNOSIS — R0981 Nasal congestion: Secondary | ICD-10-CM | POA: Diagnosis not present

## 2022-04-26 DIAGNOSIS — Z419 Encounter for procedure for purposes other than remedying health state, unspecified: Secondary | ICD-10-CM | POA: Diagnosis not present

## 2022-04-30 DIAGNOSIS — M79605 Pain in left leg: Secondary | ICD-10-CM | POA: Diagnosis not present

## 2022-04-30 DIAGNOSIS — G8929 Other chronic pain: Secondary | ICD-10-CM | POA: Diagnosis not present

## 2022-05-02 DIAGNOSIS — R519 Headache, unspecified: Secondary | ICD-10-CM | POA: Diagnosis not present

## 2022-05-02 DIAGNOSIS — R051 Acute cough: Secondary | ICD-10-CM | POA: Diagnosis not present

## 2022-05-21 DIAGNOSIS — D709 Neutropenia, unspecified: Secondary | ICD-10-CM | POA: Diagnosis not present

## 2022-05-21 DIAGNOSIS — H5712 Ocular pain, left eye: Secondary | ICD-10-CM | POA: Diagnosis not present

## 2022-05-21 DIAGNOSIS — M79605 Pain in left leg: Secondary | ICD-10-CM | POA: Diagnosis not present

## 2022-05-24 DIAGNOSIS — Z87891 Personal history of nicotine dependence: Secondary | ICD-10-CM | POA: Diagnosis not present

## 2022-05-24 DIAGNOSIS — H02844 Edema of left upper eyelid: Secondary | ICD-10-CM | POA: Diagnosis not present

## 2022-05-24 DIAGNOSIS — H5712 Ocular pain, left eye: Secondary | ICD-10-CM | POA: Diagnosis not present

## 2022-05-24 DIAGNOSIS — Z7982 Long term (current) use of aspirin: Secondary | ICD-10-CM | POA: Diagnosis not present

## 2022-05-24 DIAGNOSIS — L03213 Periorbital cellulitis: Secondary | ICD-10-CM | POA: Diagnosis not present

## 2022-05-27 DIAGNOSIS — Z419 Encounter for procedure for purposes other than remedying health state, unspecified: Secondary | ICD-10-CM | POA: Diagnosis not present

## 2022-06-18 DIAGNOSIS — Z87828 Personal history of other (healed) physical injury and trauma: Secondary | ICD-10-CM | POA: Diagnosis not present

## 2022-06-18 DIAGNOSIS — Z139 Encounter for screening, unspecified: Secondary | ICD-10-CM | POA: Diagnosis not present

## 2022-06-18 DIAGNOSIS — F419 Anxiety disorder, unspecified: Secondary | ICD-10-CM | POA: Diagnosis not present

## 2022-06-18 DIAGNOSIS — Z13228 Encounter for screening for other metabolic disorders: Secondary | ICD-10-CM | POA: Diagnosis not present

## 2022-06-18 DIAGNOSIS — Z130 Encounter for screening for diseases of the blood and blood-forming organs and certain disorders involving the immune mechanism: Secondary | ICD-10-CM | POA: Diagnosis not present

## 2022-06-18 DIAGNOSIS — G8929 Other chronic pain: Secondary | ICD-10-CM | POA: Diagnosis not present

## 2022-06-18 DIAGNOSIS — Z1331 Encounter for screening for depression: Secondary | ICD-10-CM | POA: Diagnosis not present

## 2022-06-18 DIAGNOSIS — Z1330 Encounter for screening examination for mental health and behavioral disorders, unspecified: Secondary | ICD-10-CM | POA: Diagnosis not present

## 2022-06-18 DIAGNOSIS — M79605 Pain in left leg: Secondary | ICD-10-CM | POA: Diagnosis not present

## 2022-06-26 DIAGNOSIS — Z419 Encounter for procedure for purposes other than remedying health state, unspecified: Secondary | ICD-10-CM | POA: Diagnosis not present

## 2022-07-07 ENCOUNTER — Emergency Department
Admission: EM | Admit: 2022-07-07 | Discharge: 2022-07-07 | Disposition: A | Discharge status: Home / Self Care | Payer: Medicaid Other | Source: Home / Self Care

## 2022-07-07 DIAGNOSIS — Z87891 Personal history of nicotine dependence: Secondary | ICD-10-CM | POA: Diagnosis not present

## 2022-07-07 DIAGNOSIS — J45909 Unspecified asthma, uncomplicated: Secondary | ICD-10-CM | POA: Diagnosis not present

## 2022-07-07 DIAGNOSIS — S02831A Fracture of medial orbital wall, right side, initial encounter for closed fracture: Secondary | ICD-10-CM | POA: Diagnosis not present

## 2022-07-07 DIAGNOSIS — Z7982 Long term (current) use of aspirin: Secondary | ICD-10-CM | POA: Diagnosis not present

## 2022-07-07 DIAGNOSIS — G894 Chronic pain syndrome: Secondary | ICD-10-CM | POA: Diagnosis not present

## 2022-07-07 DIAGNOSIS — S01111A Laceration without foreign body of right eyelid and periocular area, initial encounter: Secondary | ICD-10-CM | POA: Diagnosis not present

## 2022-07-07 DIAGNOSIS — S069X1A Unspecified intracranial injury with loss of consciousness of 30 minutes or less, initial encounter: Secondary | ICD-10-CM | POA: Diagnosis not present

## 2022-07-07 DIAGNOSIS — S0591XA Unspecified injury of right eye and orbit, initial encounter: Secondary | ICD-10-CM | POA: Diagnosis not present

## 2022-07-12 DIAGNOSIS — S0231XA Fracture of orbital floor, right side, initial encounter for closed fracture: Secondary | ICD-10-CM | POA: Diagnosis not present

## 2022-07-15 DIAGNOSIS — S0285XD Fracture of orbit, unspecified, subsequent encounter for fracture with routine healing: Secondary | ICD-10-CM | POA: Diagnosis not present

## 2022-07-23 DIAGNOSIS — S62336A Displaced fracture of neck of fifth metacarpal bone, right hand, initial encounter for closed fracture: Secondary | ICD-10-CM | POA: Diagnosis not present

## 2022-07-23 DIAGNOSIS — Z79899 Other long term (current) drug therapy: Secondary | ICD-10-CM | POA: Diagnosis not present

## 2022-07-23 DIAGNOSIS — Z87891 Personal history of nicotine dependence: Secondary | ICD-10-CM | POA: Diagnosis not present

## 2022-07-23 DIAGNOSIS — S62326A Displaced fracture of shaft of fifth metacarpal bone, right hand, initial encounter for closed fracture: Secondary | ICD-10-CM | POA: Diagnosis not present

## 2022-07-23 DIAGNOSIS — M25431 Effusion, right wrist: Secondary | ICD-10-CM | POA: Diagnosis not present

## 2022-07-23 DIAGNOSIS — S62306A Unspecified fracture of fifth metacarpal bone, right hand, initial encounter for closed fracture: Secondary | ICD-10-CM | POA: Diagnosis not present

## 2022-07-23 DIAGNOSIS — Z7982 Long term (current) use of aspirin: Secondary | ICD-10-CM | POA: Diagnosis not present

## 2022-07-23 DIAGNOSIS — M25531 Pain in right wrist: Secondary | ICD-10-CM | POA: Diagnosis not present

## 2022-07-23 DIAGNOSIS — R000 Tachycardia, unspecified: Secondary | ICD-10-CM | POA: Diagnosis not present

## 2022-07-23 DIAGNOSIS — Z91013 Allergy to seafood: Secondary | ICD-10-CM | POA: Diagnosis not present
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
## 2022-07-24 ENCOUNTER — Ambulatory Visit: Payer: Medicaid Other | Admitting: Nurse Practitioner

## 2022-07-24 DIAGNOSIS — Z7982 Long term (current) use of aspirin: Secondary | ICD-10-CM | POA: Diagnosis not present

## 2022-07-24 DIAGNOSIS — Z91199 Patient's noncompliance with other medical treatment and regimen due to unspecified reason: Secondary | ICD-10-CM | POA: Diagnosis not present

## 2022-07-24 DIAGNOSIS — Z91013 Allergy to seafood: Secondary | ICD-10-CM | POA: Diagnosis not present

## 2022-07-24 DIAGNOSIS — Z79899 Other long term (current) drug therapy: Secondary | ICD-10-CM | POA: Diagnosis not present

## 2022-07-24 DIAGNOSIS — S62306A Unspecified fracture of fifth metacarpal bone, right hand, initial encounter for closed fracture: Secondary | ICD-10-CM | POA: Diagnosis not present

## 2022-07-24 DIAGNOSIS — S62326A Displaced fracture of shaft of fifth metacarpal bone, right hand, initial encounter for closed fracture: Secondary | ICD-10-CM | POA: Diagnosis not present

## 2022-07-25 DIAGNOSIS — G8929 Other chronic pain: Secondary | ICD-10-CM | POA: Diagnosis not present

## 2022-07-25 DIAGNOSIS — S0285XA Fracture of orbit, unspecified, initial encounter for closed fracture: Secondary | ICD-10-CM | POA: Diagnosis not present

## 2022-07-25 DIAGNOSIS — S62101A Fracture of unspecified carpal bone, right wrist, initial encounter for closed fracture: Secondary | ICD-10-CM | POA: Diagnosis not present

## 2022-07-25 DIAGNOSIS — Z0289 Encounter for other administrative examinations: Secondary | ICD-10-CM | POA: Diagnosis not present

## 2022-07-25 DIAGNOSIS — M79605 Pain in left leg: Secondary | ICD-10-CM | POA: Diagnosis not present

## 2022-07-25 DIAGNOSIS — Z1331 Encounter for screening for depression: Secondary | ICD-10-CM | POA: Diagnosis not present

## 2022-07-25 DIAGNOSIS — F419 Anxiety disorder, unspecified: Secondary | ICD-10-CM | POA: Diagnosis not present

## 2022-07-26 DIAGNOSIS — S62326S Displaced fracture of shaft of fifth metacarpal bone, right hand, sequela: Secondary | ICD-10-CM | POA: Diagnosis not present

## 2022-07-26 DIAGNOSIS — M79641 Pain in right hand: Secondary | ICD-10-CM | POA: Diagnosis not present

## 2022-07-26 DIAGNOSIS — S62306A Unspecified fracture of fifth metacarpal bone, right hand, initial encounter for closed fracture: Secondary | ICD-10-CM | POA: Diagnosis not present

## 2022-07-27 DIAGNOSIS — Z419 Encounter for procedure for purposes other than remedying health state, unspecified: Secondary | ICD-10-CM | POA: Diagnosis not present

## 2022-08-14 DIAGNOSIS — S62306A Unspecified fracture of fifth metacarpal bone, right hand, initial encounter for closed fracture: Secondary | ICD-10-CM | POA: Diagnosis not present

## 2022-08-14 DIAGNOSIS — S62339A Displaced fracture of neck of unspecified metacarpal bone, initial encounter for closed fracture: Secondary | ICD-10-CM | POA: Diagnosis not present

## 2022-08-14 DIAGNOSIS — S62336D Displaced fracture of neck of fifth metacarpal bone, right hand, subsequent encounter for fracture with routine healing: Secondary | ICD-10-CM | POA: Diagnosis not present

## 2022-08-14 DIAGNOSIS — Z91013 Allergy to seafood: Secondary | ICD-10-CM | POA: Diagnosis not present

## 2022-08-15 DIAGNOSIS — G5772 Causalgia of left lower limb: Secondary | ICD-10-CM | POA: Diagnosis not present

## 2022-08-15 DIAGNOSIS — M79605 Pain in left leg: Secondary | ICD-10-CM | POA: Diagnosis not present

## 2022-08-15 DIAGNOSIS — M222X9 Patellofemoral disorders, unspecified knee: Secondary | ICD-10-CM | POA: Diagnosis not present

## 2022-08-26 DIAGNOSIS — Z419 Encounter for procedure for purposes other than remedying health state, unspecified: Secondary | ICD-10-CM | POA: Diagnosis not present

## 2022-09-18 DIAGNOSIS — S62336D Displaced fracture of neck of fifth metacarpal bone, right hand, subsequent encounter for fracture with routine healing: Secondary | ICD-10-CM | POA: Diagnosis not present

## 2022-09-26 DIAGNOSIS — Z419 Encounter for procedure for purposes other than remedying health state, unspecified: Secondary | ICD-10-CM | POA: Diagnosis not present

## 2022-10-16 DIAGNOSIS — R051 Acute cough: Secondary | ICD-10-CM | POA: Diagnosis not present

## 2022-10-16 DIAGNOSIS — R509 Fever, unspecified: Secondary | ICD-10-CM | POA: Diagnosis not present

## 2022-10-16 DIAGNOSIS — H66002 Acute suppurative otitis media without spontaneous rupture of ear drum, left ear: Secondary | ICD-10-CM | POA: Diagnosis not present

## 2022-10-23 DIAGNOSIS — S62336A Displaced fracture of neck of fifth metacarpal bone, right hand, initial encounter for closed fracture: Secondary | ICD-10-CM | POA: Diagnosis not present

## 2022-10-23 DIAGNOSIS — Z5941 Food insecurity: Secondary | ICD-10-CM | POA: Diagnosis not present

## 2022-10-23 DIAGNOSIS — S62306A Unspecified fracture of fifth metacarpal bone, right hand, initial encounter for closed fracture: Secondary | ICD-10-CM | POA: Diagnosis not present

## 2022-10-23 DIAGNOSIS — Z7982 Long term (current) use of aspirin: Secondary | ICD-10-CM | POA: Diagnosis not present

## 2022-10-27 DIAGNOSIS — Z419 Encounter for procedure for purposes other than remedying health state, unspecified: Secondary | ICD-10-CM | POA: Diagnosis not present

## 2022-11-08 DIAGNOSIS — M25541 Pain in joints of right hand: Secondary | ICD-10-CM | POA: Diagnosis not present

## 2022-11-08 DIAGNOSIS — S61259A Open bite of unspecified finger without damage to nail, initial encounter: Secondary | ICD-10-CM | POA: Diagnosis not present

## 2022-11-08 DIAGNOSIS — M79644 Pain in right finger(s): Secondary | ICD-10-CM | POA: Diagnosis not present

## 2022-11-08 DIAGNOSIS — W503XXA Accidental bite by another person, initial encounter: Secondary | ICD-10-CM | POA: Diagnosis not present

## 2022-11-08 DIAGNOSIS — S61051A Open bite of right thumb without damage to nail, initial encounter: Secondary | ICD-10-CM | POA: Diagnosis not present

## 2022-11-08 DIAGNOSIS — S61001A Unspecified open wound of right thumb without damage to nail, initial encounter: Secondary | ICD-10-CM | POA: Diagnosis not present

## 2022-11-29 ENCOUNTER — Telehealth: Payer: Self-pay

## 2022-11-29 NOTE — Telephone Encounter (Signed)
  Medicaid Managed Care   Unsuccessful Outreach Note  11/29/2022 Name: James Sampson MRN: [id number] DOB: 1995/06/14  Referred by: Pcp, No Reason for referral : No chief complaint on file.   An unsuccessful telephone outreach was attempted today. The patient was referred to the case management team for assistance with care management and care coordination.   Follow Up Plan: If patient returns call to provider office, please advise to call Embedded Care Management Care Guide Nicholes Rough* at [phone]*  Nicholes Rough, CMA Care Guide [employer]

## 2022-12-03 DIAGNOSIS — F439 Reaction to severe stress, unspecified: Secondary | ICD-10-CM | POA: Diagnosis not present

## 2022-12-04 DIAGNOSIS — R519 Headache, unspecified: Secondary | ICD-10-CM | POA: Diagnosis not present

## 2022-12-05 DIAGNOSIS — F439 Reaction to severe stress, unspecified: Secondary | ICD-10-CM | POA: Diagnosis not present

## 2022-12-27 DEATH — deceased
